# Patient Record
Sex: Male | Born: 1970 | Race: Black or African American | Hispanic: No | Marital: Married | Smoking: Never smoker
Health system: Southern US, Community
[De-identification: ages and names within clinical notes are randomized; demographics above are authoritative.]

## PROBLEM LIST (undated history)

## (undated) DIAGNOSIS — J45909 Unspecified asthma, uncomplicated: Secondary | ICD-10-CM

## (undated) HISTORY — DX: Unspecified asthma, uncomplicated: J45.909

## (undated) HISTORY — PX: HERNIA REPAIR: SHX51

---

## 2003-04-03 ENCOUNTER — Ambulatory Visit (HOSPITAL_COMMUNITY): Admission: RE | Admit: 2003-04-03 | Discharge: 2003-04-03 | Payer: Self-pay | Admitting: Family Medicine

## 2003-07-22 ENCOUNTER — Emergency Department (HOSPITAL_COMMUNITY): Admission: EM | Admit: 2003-07-22 | Discharge: 2003-07-22 | Payer: Self-pay | Admitting: Emergency Medicine

## 2004-01-01 ENCOUNTER — Ambulatory Visit (HOSPITAL_COMMUNITY): Admission: RE | Admit: 2004-01-01 | Discharge: 2004-01-01 | Payer: Self-pay | Admitting: General Surgery

## 2004-01-01 ENCOUNTER — Ambulatory Visit (HOSPITAL_BASED_OUTPATIENT_CLINIC_OR_DEPARTMENT_OTHER): Admission: RE | Admit: 2004-01-01 | Discharge: 2004-01-01 | Payer: Self-pay | Admitting: General Surgery

## 2004-08-08 ENCOUNTER — Emergency Department (HOSPITAL_COMMUNITY): Admission: EM | Admit: 2004-08-08 | Discharge: 2004-08-08 | Payer: Self-pay | Admitting: Emergency Medicine

## 2006-03-27 ENCOUNTER — Emergency Department (HOSPITAL_COMMUNITY): Admission: EM | Admit: 2006-03-27 | Discharge: 2006-03-28 | Payer: Self-pay | Admitting: Emergency Medicine

## 2007-02-20 ENCOUNTER — Emergency Department (HOSPITAL_COMMUNITY): Admission: EM | Admit: 2007-02-20 | Discharge: 2007-02-20 | Payer: Self-pay | Admitting: Emergency Medicine

## 2007-03-28 ENCOUNTER — Emergency Department (HOSPITAL_COMMUNITY): Admission: EM | Admit: 2007-03-28 | Discharge: 2007-03-28 | Payer: Self-pay | Admitting: Family Medicine

## 2008-03-01 ENCOUNTER — Emergency Department (HOSPITAL_COMMUNITY): Admission: EM | Admit: 2008-03-01 | Discharge: 2008-03-01 | Payer: Self-pay | Admitting: Emergency Medicine

## 2008-07-23 ENCOUNTER — Emergency Department (HOSPITAL_COMMUNITY): Admission: EM | Admit: 2008-07-23 | Discharge: 2008-07-23 | Payer: Self-pay | Admitting: Family Medicine

## 2010-09-06 NOTE — Op Note (Signed)
NAME:  Derek Nguyen, Derek Nguyen                          ACCOUNT NO.:  [id number]   MEDICAL RECORD NO.:  [id number]                   PATIENT TYPE:  AMB   LOCATION:  DSC                                  FACILITY:  [facility]   PHYSICIAN:  Anselm Pancoast. Zachery Dakins, M.D.          DATE OF BIRTH:  [birth year]   DATE OF PROCEDURE:  01/01/2004  DATE OF DISCHARGE:                                 OPERATIVE REPORT   PREOPERATIVE DIAGNOSIS:  Umbilical hernia.   POSTOPERATIVE DIAGNOSIS:  Umbilical hernia.   OPERATION PERFORMED:  Repair of umbilical hernia.   SURGEON:  Anselm Pancoast. Zachery Dakins, M.D.   ANESTHESIA:  General.   INDICATIONS FOR PROCEDURE:  Derek Nguyen is a 40 year old African who was  referred by Dr. Ronne Binning for management of a symptomatic umbilical hernia.  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  The patient is quite thin, works I think as a  Glass blower/designer for the [employer] and I recommended that we repair this  with general anesthesia and possibility of using mesh.   DESCRIPTION OF PROCEDURE:  The patient preoperatively was given a gram of  Kefzol, taken to the operative suite and induction of general anesthesia.  The abdomen was prepped with Betadine and surgical scrub and solution and  draped in a sterile manner.  I had made a small umbilical incision really  right though the inferior third of the actual hernia sac since it is so  large defect. I then separated this skin from the underlying hernia sac and  abdominal wall fascia.  I opened into the actual hernia sac since it was so  adherent to the actual skin defect and then dissected back circumferentially  about 3 cm in all directions so you can get to where the base of the fascia  was.  The fascia itself was thin as the patient is thin and is really not  amenable to try to make two layers to put a piece  of Prolene mesh within the  inner areas and I basically repaired this in a pants over vest manner with  two good layers of the fascia and repaired it with 0 Prolene U-stitches.  I  then used a 3-0 Prolene to suture the inferior area so it was lying flat and  then I had actually anesthetized the area with Marcaine for postoperative  pain.  Then the skin was closed after a few subcuticular sutures of 3-0  Vicryl were placed with interrupted sutures.  I trimmed a little bit of the  skin so it lay basically flat and then a little compressive dressing  applied.  The patient will be seen in the office in approximately a week. I  think he will be able to return to work in about three weeks.  Sooner if  they can put him on light duties for approximately two weeks.                                               Anselm Pancoast. Zachery Dakins, M.D.    WJW/MEDQ  D:  01/01/2004  T:  01/01/2004  Job:  [id number]

## 2010-09-06 NOTE — Op Note (Signed)
NAME:  Derek Nguyen, Derek Nguyen                          ACCOUNT NO.:  [id number]   MEDICAL RECORD NO.:  [id number]                   PATIENT TYPE:  OUT   LOCATION:  XRAY                                 FACILITY:  [facility]   PHYSICIAN:  Hilda Lias, M.D.                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   POSTOPERATIVE DIAGNOSIS:  Chronic left L4 radiculopathy secondary to  herniated disk at the level of 3-4 with stenosis at the level of 4-5.   PROCEDURE:  Bilateral 3-4 and 4-5  laminotomies and foraminotomies. Left L3-  4 diskectomy. Microscope.   SURGEON:  Hilda Lias, M.D.   ASSISTANT:  Derek Nguyen, M.D.   INDICATIONS FOR PROCEDURE:  Derek Nguyen is a 40 year old gentleman who was  seen by me in my office because of a history of back pain. This problem  started back in May 2003, when he was at work he was working with some heavy  material and developed sudden onset of back pain radiating down to both  legs. The patient has had conservative treatment and he is not any better.  By the time I saw him,  he had a weak dorsiflexion on the left foot. He has  a burning sensation  also on the left foot. Reflexes were symmetrical. The  MRI showed that he has a herniated disk central to the left at the level of  3-4 with bilateral stenosis at the level of 4-5. He also has some  degenerative disk disease at the level of 5-1.   Because of these findings, surgery was advised. The risks were explained  including  the possibility of no improvement whatsoever because of the  chronicity of the pain, CSF leak, and need for further  surgery which might  involve fusion.   DESCRIPTION OF PROCEDURE:  The patient was taken to the operating room and  after  intubation he was positioned in a prone manner. The back was prepped  with Betadine.   A midline incision from L3 to L4-5 was made. The muscles were retracted  laterally. We  identified  the L4-L5 and L3-S4 space. At the level of the 3-  4 we proceeded with a laminotomy after removing the lower lamina of L3 and  the upper L4. The same procedure was done on the opposite side.   On the left side we found that indeed he had a herniated disk extending to  the left. An incision was made using the pituitary rongeurs. Total gross  diskectomy laterally was accomplished. At the end we had good decompression  for the L3 and L4 nerve roots.   On the right  side we found that he had  stenosis and with the laminotomy,  then after removal of the yellow ligament we  proceeded  with the  foraminotomy to decompress the L3-L4 nerve root. Then our attention was at  the level of 4-5.   The patient at this level had quite a bit of stenosis. A laminotomy at 4-5  bilaterally  was achieved. The yellow ligament also was excised. At this  level we found quite a bit of stenosis, and using the 1, 2 and 3-mm Kerrison  punches we were able to do a foraminotomy. Decompression of the L4-L5 nerve  root was achieved. Bilaterally  we found that the patient had quite a bit of  scar tissue mostly involving the L5 nerve root and lysis was accomplished.   Having decompression, a Valsalva maneuver was  negative. The area was  irrigated. Bio glue was left into the area, especially at the level of 4-5  to prevent the possibility of a CSF leak because of the lysis of adhesions.  Then the area was irrigated and the wound was closed with Vicryl and Steri-  Strips. The patient did well.                                               Hilda Lias, M.D.    EB/MEDQ  D:  05/05/2003  T:  05/05/2003  Job:  [id number]

## 2013-04-06 ENCOUNTER — Ambulatory Visit: Payer: 59 | Admitting: Family Medicine

## 2013-04-06 VITALS — BP 132/82 | HR 71 | Temp 98.3°F | Resp 17 | Ht 68.5 in | Wt 188.0 lb

## 2013-04-06 DIAGNOSIS — J45901 Unspecified asthma with (acute) exacerbation: Secondary | ICD-10-CM

## 2013-04-06 DIAGNOSIS — R0602 Shortness of breath: Secondary | ICD-10-CM

## 2013-04-06 DIAGNOSIS — J45909 Unspecified asthma, uncomplicated: Secondary | ICD-10-CM

## 2013-04-06 MED ORDER — ALBUTEROL SULFATE (2.5 MG/3ML) 0.083% IN NEBU
2.5000 mg | INHALATION_SOLUTION | Freq: Once | RESPIRATORY_TRACT | Status: AC
  Administered 2013-04-06: 2.5 mg via RESPIRATORY_TRACT

## 2013-04-06 MED ORDER — ALBUTEROL SULFATE HFA 108 (90 BASE) MCG/ACT IN AERS: 2.0000 | INHALATION_SPRAY | RESPIRATORY_TRACT | Status: AC | PRN

## 2013-04-06 NOTE — Progress Notes (Signed)
   Subjective:    Patient ID: Derek Nguyen, male    DOB: [birth year], 42 y.o.   MRN: [id number]  HPI Patient presents for breathing problem. In 2010 tried to shampoo carpet and noticed that strong chemical odor caused him to have difficulty breathing. Has history of childhood asthma. Since has had intermittent difficulties breathing. Last night was with friends and they were smoking and really flared breathing problems. Had difficulty breathing last night and could not breathing. Still feeling very tight in chest. Albuterol previously very helpful but has run out. Was using albuterol infrequently and only when in contact with air irritants. Has not required prednisone. Not hospitalized for asthma. No fever, has dry cough. Has chest pain only with coughing. + SOB. Has improved from last night.   Review of Systems  All other systems reviewed and are negative.      Objective:   Physical Exam  Constitutional: He is oriented to person, place, and time. He appears well-developed and well-nourished. No distress.  HENT:  Head: Normocephalic and atraumatic.  Right Ear: External ear normal.  Left Ear: External ear normal.  Mouth/Throat: Oropharynx is clear and moist. No oropharyngeal exudate.  Eyes: Conjunctivae are normal. Pupils are equal, round, and reactive to light. No scleral icterus.  Neck: Normal range of motion. Neck supple.  Cardiovascular: Normal rate, regular rhythm and normal heart sounds.   Pulmonary/Chest: Effort normal. No respiratory distress. He has wheezes.  Musculoskeletal: Normal range of motion.  Lymphadenopathy:    He has no cervical adenopathy.  Neurological: He is alert and oriented to person, place, and time.  Skin: Skin is warm and dry. He is not diaphoretic.  Psychiatric: He has a normal mood and affect. His behavior is normal.   Peak Flow = 250 (normal 509)    Assessment & Plan:  #1. Asthma Exacerbation - Peak flow < 50% of predicted - Will do albuterol  treatment here and recheck peak flow - DC home with albuterol inhaler and spacer.  - Post treatment improved air movement and decreased wheezing. Peak flow still 250 - F/u if worsening symptoms - F/u with PCP within 1 month

## 2013-04-06 NOTE — Patient Instructions (Signed)
Thank you for coming in today  Please use spacer with your inhaler Please use inhaler every 4 hrs for the next day and then as needed See your regular doctor within 1 month Return if your breathing worsens  Asthma, Adult Asthma is a recurring condition in which the airways tighten and narrow. Asthma can make it difficult to breathe. It can cause coughing, wheezing, and shortness of breath. Asthma episodes (also called asthma attacks) range from minor to life-threatening. Asthma cannot be cured, but medicines and lifestyle changes can help control it. CAUSES Asthma is believed to be caused by inherited (genetic) and environmental factors, but its exact cause is unknown. Asthma may be triggered by allergens, lung infections, or irritants in the air. Asthma triggers are different for each person. Common triggers include:   Animal dander.  Dust mites.  Cockroaches.  Pollen from trees or grass.  Mold.  Smoke.  Air pollutants such as dust, household cleaners, hair sprays, aerosol sprays, paint fumes, strong chemicals, or strong odors.  Cold air, weather changes, and winds (which increase molds and pollens in the air).  Strong emotional expressions such as crying or laughing hard.  Stress.  Certain medicines (such as aspirin) or types of drugs (such as beta-blockers).  Sulfites in foods and drinks. Foods and drinks that may contain sulfites include dried fruit, potato chips, and sparkling grape juice.  Infections or inflammatory conditions such as the flu, a cold, or an inflammation of the nasal membranes (rhinitis).  Gastroesophageal reflux disease (GERD).  Exercise or strenuous activity. SYMPTOMS Symptoms may occur immediately after asthma is triggered or many hours later. Symptoms include:  Wheezing.  Excessive nighttime or early morning coughing.  Frequent or severe coughing with a common cold.  Chest tightness.  Shortness of breath. DIAGNOSIS  The diagnosis of  asthma is made by a review of your medical history and a physical exam. Tests may also be performed. These may include:  Lung function studies. These tests show how much air you breath in and out.  Allergy tests.  Imaging tests such as X-rays. TREATMENT  Asthma cannot be cured, but it can usually be controlled. Treatment involves identifying and avoiding your asthma triggers. It also involves medicines. There are 2 classes of medicine used for asthma treatment:   Controller medicines. These prevent asthma symptoms from occurring. They are usually taken every day.  Reliever or rescue medicines. These quickly relieve asthma symptoms. They are used as needed and provide short-term relief. Your health care provider will help you create an asthma action plan. An asthma action plan is a written plan for managing and treating your asthma attacks. It includes a list of your asthma triggers and how they may be avoided. It also includes information on when medicines should be taken and when their dosage should be changed. An action plan may also involve the use of a device called a peak flow meter. A peak flow meter measures how well the lungs are working. It helps you monitor your condition. HOME CARE INSTRUCTIONS   Take medicine as directed by your health care provider. Speak with your health care provider if you have questions about how or when to take the medicines.  Use a peak flow meter as directed by your health care provider. Record and keep track of readings.  Understand and use the action plan to help minimize or stop an asthma attack without needing to seek medical care.  Control your home environment in the following ways to help  prevent asthma attacks:  Do not smoke. Avoid being exposed to secondhand smoke.  Change your heating and air conditioning filter regularly.  Limit your use of fireplaces and wood stoves.  Get rid of pests (such as roaches and mice) and their  droppings.  Throw away plants if you see mold on them.  Clean your floors and dust regularly. Use unscented cleaning products.  Try to have someone else vacuum for you regularly. Stay out of rooms while they are being vacuumed and for a short while afterward. If you vacuum, use a dust mask from a hardware store, a double-layered or microfilter vacuum cleaner bag, or a vacuum cleaner with a HEPA filter.  Replace carpet with wood, tile, or vinyl flooring. Carpet can trap dander and dust.  Use allergy-proof pillows, mattress covers, and box spring covers.  Wash bed sheets and blankets every week in hot water and dry them in a dryer.  Use blankets that are made of polyester or cotton.  Clean bathrooms and kitchens with bleach. If possible, have someone repaint the walls in these rooms with mold-resistant paint. Keep out of the rooms that are being cleaned and painted.  Wash hands frequently. SEEK MEDICAL CARE IF:   You have wheezing, shortness of breath, or a cough even if taking medicine to prevent attacks.  The colored mucus you cough up (sputum) is thicker than usual.  Your sputum changes from clear or white to yellow, green, gray, or bloody.  You have any problems that may be related to the medicines you are taking (such as a rash, itching, swelling, or trouble breathing).  You are using a reliever medicine more than 2 3 times per week.  Your peak flow is still at 50 79% of you personal best after following your action plan for 1 hour. SEEK IMMEDIATE MEDICAL CARE IF:   You seem to be getting worse and are unresponsive to treatment during an asthma attack.  You are short of breath even at rest.  You get short of breath when doing very little physical activity.  You have difficulty eating, drinking, or talking due to asthma symptoms.  You develop chest pain.  You develop a fast heartbeat.  You have a bluish color to your lips or fingernails.  You are lightheaded, dizzy,  or faint.  Your peak flow is less than 50% of your personal best.  You have a fever or persistent symptoms for more than 2 3 days.  You have a fever and symptoms suddenly get worse. MAKE SURE YOU:   Understand these instructions.  Will watch your condition.  Will get help right away if you are not doing well or get worse. Document Released: 04/07/2005 Document Revised: 12/08/2012 Document Reviewed: 11/04/2012 [facility] Patient Information 2014 [place], [place].

## 2013-05-19 NOTE — Progress Notes (Signed)
Reviewed documentation and agree w/ assessment and plan. Urho Rio, MD MPH 

## 2016-05-21 DIAGNOSIS — Z23 Encounter for immunization: Secondary | ICD-10-CM | POA: Diagnosis not present

## 2016-05-21 DIAGNOSIS — J45909 Unspecified asthma, uncomplicated: Secondary | ICD-10-CM | POA: Diagnosis not present

## 2017-01-20 DIAGNOSIS — Z23 Encounter for immunization: Secondary | ICD-10-CM | POA: Diagnosis not present

## 2017-02-05 DIAGNOSIS — Z0000 Encounter for general adult medical examination without abnormal findings: Secondary | ICD-10-CM | POA: Diagnosis not present

## 2017-08-18 DIAGNOSIS — R42 Dizziness and giddiness: Secondary | ICD-10-CM | POA: Diagnosis not present

## 2018-03-02 DIAGNOSIS — Z0000 Encounter for general adult medical examination without abnormal findings: Secondary | ICD-10-CM | POA: Diagnosis not present

## 2018-03-02 DIAGNOSIS — Z13220 Encounter for screening for lipoid disorders: Secondary | ICD-10-CM | POA: Diagnosis not present

## 2018-03-02 DIAGNOSIS — Z23 Encounter for immunization: Secondary | ICD-10-CM | POA: Diagnosis not present

## 2019-04-26 ENCOUNTER — Emergency Department (INDEPENDENT_AMBULATORY_CARE_PROVIDER_SITE_OTHER): Payer: 59

## 2019-04-26 ENCOUNTER — Emergency Department (INDEPENDENT_AMBULATORY_CARE_PROVIDER_SITE_OTHER)
Admission: EM | Admit: 2019-04-26 | Discharge: 2019-04-26 | Disposition: A | Discharge status: Home / Self Care | Payer: 59 | Source: Home / Self Care

## 2019-04-26 ENCOUNTER — Other Ambulatory Visit: Payer: Self-pay

## 2019-04-26 DIAGNOSIS — R0789 Other chest pain: Secondary | ICD-10-CM | POA: Diagnosis not present

## 2019-04-26 DIAGNOSIS — R079 Chest pain, unspecified: Secondary | ICD-10-CM

## 2019-04-26 NOTE — ED Triage Notes (Signed)
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  Was seen by person at work, and referred here

## 2019-04-26 NOTE — Discharge Instructions (Addendum)
Your EKG and CXR are normal This pain in your chest is likely muscular You will need to quarantine until receiving the results of your COVID swab We will call you with results of your COVID swab and labs You can take tylenol, motrin, aleve, ibuprofen for the pain

## 2019-04-26 NOTE — ED Provider Notes (Signed)
[facility]    CSN: [id number] Arrival date & time: 04/26/19  1446      History   Chief Complaint Chief Complaint  Patient presents with  . Chest Pain    HPI Derek Nguyen is a 49 y.o. male.   49 year old male, with history of asthma, presenting today complaining of chest pain.  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  States that shortly after doing so, he developed some chest pain.  States that this pain is worse with movement of his neck as well as his chest wall.  States that he was sent from work to be evaluated to the chest pain.  States that he is concerned this may related to Covid.  He denies any cough or shortness of breath.  The history is provided by the patient.  Chest Pain Pain location:  Substernal area Pain quality: aching   Pain radiates to:  Does not radiate Pain severity:  Mild Onset quality:  Gradual Duration:  5 hours Timing:  Constant Progression:  Unchanged Chronicity:  New Context: lifting and movement   Context: not breathing, not drug use and not eating   Relieved by:  Nothing Worsened by:  Nothing Ineffective treatments:  None tried Associated symptoms: no abdominal pain, no altered mental status, no anxiety, no back pain, no claudication, no cough, no diaphoresis, no dizziness, no fatigue, no fever, no headache, no lower extremity edema, no near-syncope, no numbness, no palpitations, no shortness of breath, no vomiting and no weakness   Risk factors: male sex and surgery   Risk factors: no aortic disease, no coronary artery disease, no Ehlers-Danlos syndrome, no high cholesterol, no immobilization, not obese and no prior DVT/PE     Past Medical History:  Diagnosis Date  . Asthma     Patient Active Problem List   Diagnosis Date Noted  . Asthma, chronic 04/06/2013    Past Surgical History:  Procedure Laterality Date  . HERNIA REPAIR         Home Medications     Prior to Admission medications   Medication Sig Start Date End Date Taking? Authorizing Provider  albuterol (PROVENTIL HFA;VENTOLIN HFA) 108 (90 BASE) MCG/ACT inhaler Inhale 2 puffs into the lungs every 4 (four) hours as needed for wheezing or shortness of breath (cough, shortness of breath or wheezing.). 04/06/13   Daine Gip, MD    Family History History reviewed. No pertinent family history.  Social History Social History   Tobacco Use  . Smoking status: Never Smoker  . Smokeless tobacco: Never Used  Substance Use Topics  . Alcohol use: No  . Drug use: No     Allergies   Ibuprofen   Review of Systems Review of Systems  Constitutional: Negative for chills, diaphoresis, fatigue and fever.  HENT: Negative for ear pain and sore throat.   Eyes: Negative for pain and visual disturbance.  Respiratory: Negative for cough and shortness of breath.   Cardiovascular: Positive for chest pain. Negative for palpitations, claudication and near-syncope.  Gastrointestinal: Negative for abdominal pain and vomiting.  Genitourinary: Negative for dysuria and hematuria.  Musculoskeletal: Negative for arthralgias and back pain.  Skin: Negative for color change and rash.  Neurological: Negative for dizziness, seizures, syncope, weakness, numbness and headaches.  All other systems reviewed and are negative.    Physical Exam Triage Vital Signs ED Triage Vitals  Enc Vitals Group     BP  Pulse      Resp      Temp      Temp src      SpO2      Weight      Height      Head Circumference      Peak Flow      Pain Score      Pain Loc      Pain Edu?      Excl. in [place]?    No data found.  Updated Vital Signs BP 132/80 (BP Location: Left Arm)   Pulse 60   Temp 98.6 F (37 C) (Oral)   Resp 20   Ht 5\' 8"  (1.727 m)   Wt 207 lb (93.9 kg)   SpO2 98%   BMI 31.47 kg/m   Visual Acuity Right Eye Distance:   Left Eye Distance:   Bilateral Distance:    Right Eye Near:   Left  Eye Near:    Bilateral Near:     Physical Exam Vitals and nursing note reviewed.  Constitutional:      Appearance: He is well-developed.  HENT:     Head: Normocephalic and atraumatic.  Eyes:     Conjunctiva/sclera: Conjunctivae normal.  Cardiovascular:     Rate and Rhythm: Normal rate and regular rhythm.     Pulses: Normal pulses.     Heart sounds: No murmur.  Pulmonary:     Effort: Pulmonary effort is normal. No respiratory distress.     Breath sounds: Normal breath sounds.  Abdominal:     Palpations: Abdomen is soft.     Tenderness: There is no abdominal tenderness.  Musculoskeletal:     Cervical back: Neck supple.  Skin:    General: Skin is warm and dry.  Neurological:     Mental Status: He is alert.      UC Treatments / Results  Labs (all labs ordered are listed, but only abnormal results are displayed) Labs Reviewed  NOVEL CORONAVIRUS, NAA  COMPLETE METABOLIC PANEL WITH GFR  TROPONIN I (HIGH SENSITIVITY)    EKG   Radiology DG Chest 2 View  Result Date: 04/26/2019 CLINICAL DATA:  Sharp chest pain after lifting at work. EXAM: CHEST - 2 VIEW COMPARISON:  02/20/2007 FINDINGS: Heart size is normal. Mediastinal shadows are normal. Lungs are clear. The vascularity is normal. No effusions. No significant bone finding. IMPRESSION: Normal chest Electronically Signed   By: Nelson Chimes M.D.   On: 04/26/2019 15:43    Procedures Procedures (including critical care time)  Medications Ordered in UC Medications - No data to display  Initial Impression / Assessment and Plan / UC Course  I have reviewed the triage vital signs and the nursing notes.  Pertinent labs & imaging results that were available during my care of the patient were reviewed by me and considered in my medical decision making (see chart for details).     Chest pain is started this morning after lifting heavy pallets at work.  Patient said persistent pain for about 5 hours.  Worsened with movement of  the neck as well as the chest wall.  He has no diaphoresis, nausea vomiting, shortness of breath, radiation of the pain.  Patient has no cardiac risk factors.  EKG, chest x-ray, labs and Covid swab pending.  Patient has a heart score of one. EKG and chest x-ray unremarkable.  Labs and Covid swab pending.  He will quarantine until receiving results. Return precautions discussed Final Clinical Impressions(s) / UC  Diagnoses   Final diagnoses:  Chest pain, unspecified type  Chest wall pain     Discharge Instructions     Your EKG and CXR are normal This pain in your chest is likely muscular You will need to quarantine until receiving the results of your COVID swab We will call you with results of your COVID swab and labs You can take tylenol, motrin, aleve, ibuprofen for the pain    ED Prescriptions    None     PDMP not reviewed this encounter.   Alecia Lemming, [place] 04/26/19 1549

## 2019-04-27 ENCOUNTER — Telehealth: Payer: Self-pay

## 2019-04-27 LAB — COMPLETE METABOLIC PANEL WITH GFR
AG Ratio: 1.2 (calc) (ref 1.0–2.5)
ALT: 32 U/L (ref 9–46)
AST: 26 U/L (ref 10–40)
Albumin: 4.1 g/dL (ref 3.6–5.1)
Alkaline phosphatase (APISO): 55 U/L (ref 36–130)
BUN: 10 mg/dL (ref 7–25)
CO2: 25 mmol/L (ref 20–32)
Calcium: 9.1 mg/dL (ref 8.6–10.3)
Chloride: 105 mmol/L (ref 98–110)
Creat: 0.94 mg/dL (ref 0.60–1.35)
GFR, Est African American: 110 mL/min/{1.73_m2} (ref >=60)
GFR, Est Non African American: 95 mL/min/{1.73_m2} (ref >=60)
Globulin: 3.3 g/dL (calc) (ref 1.9–3.7)
Glucose, Bld: 89 mg/dL (ref 65–99)
Potassium: 3.8 mmol/L (ref 3.5–5.3)
Sodium: 139 mmol/L (ref 135–146)
Total Bilirubin: 0.5 mg/dL (ref 0.2–1.2)
Total Protein: 7.4 g/dL (ref 6.1–8.1)

## 2019-04-27 LAB — TROPONIN I: Troponin I: <0.01 ng/mL (ref <=0.0)

## 2019-04-27 NOTE — Telephone Encounter (Signed)
Called asking about test results. Informed covid results are not back yet, usually take 3-5 days. Pt acknowledges.

## 2019-04-29 LAB — NOVEL CORONAVIRUS, NAA: SARS-CoV-2, NAA: NOT DETECTED

## 2021-03-29 ENCOUNTER — Ambulatory Visit (INDEPENDENT_AMBULATORY_CARE_PROVIDER_SITE_OTHER): Payer: 59 | Admitting: Podiatry

## 2021-03-29 ENCOUNTER — Other Ambulatory Visit: Payer: Self-pay

## 2021-03-29 ENCOUNTER — Ambulatory Visit (INDEPENDENT_AMBULATORY_CARE_PROVIDER_SITE_OTHER): Payer: 59

## 2021-03-29 ENCOUNTER — Encounter: Payer: Self-pay | Admitting: Podiatry

## 2021-03-29 DIAGNOSIS — M7672 Peroneal tendinitis, left leg: Secondary | ICD-10-CM

## 2021-03-29 DIAGNOSIS — M25572 Pain in left ankle and joints of left foot: Secondary | ICD-10-CM

## 2021-03-29 DIAGNOSIS — M79672 Pain in left foot: Secondary | ICD-10-CM

## 2021-03-29 NOTE — Patient Instructions (Signed)
Peroneal Tendinopathy Rehab ?Ask your health care provider which exercises are safe for you. Do exercises exactly as told by your health care provider and adjust them as directed. It is normal to feel mild stretching, pulling, tightness, or discomfort as you do these exercises. Stop right away if you feel sudden pain or your pain gets worse. Do not begin these exercises until told by your health care provider. ?Stretching and range-of-motion exercises ?These exercises warm up your muscles and joints and improve the movement and flexibility of your ankle. These exercises also help to relieve pain and stiffness. ?Gastroc and soleus stretch, standing ?This is an exercise in which you stand on a step and use your body weight to stretch your calf muscles. To do this exercise: ?Stand on the edge of a step on the ball of your left / right foot. The ball of your foot is on the walking surface, right under your toes. ?Keep your other foot firmly on the same step. ?Hold on to the wall, a railing, or a chair for balance. ?Slowly lift your other foot, allowing your body weight to press your left / right heel down over the edge of the step. You should feel a stretch in your left / right calf (gastrocnemius and soleus). ?Hold this position for __________ seconds. ?Return both feet to the step. ?Repeat this exercise with a slight bend in your left / right knee. ?Repeat __________ times with your left / right knee straight and __________ times with your left / right knee bent. Complete this exercise __________ times a day. ?Strengthening exercises ?These exercises build strength and endurance in your foot and ankle. Endurance is the ability to use your muscles for a long time, even after they get tired. ?Ankle dorsiflexion with band ? ?Secure a rubber exercise band or tube to an object, such as a table leg, that will not move when the band is pulled. ?Secure the other end of the band around your left / right foot. ?Sit on the  floor, facing the object with your left / right leg extended. The band or tube should be slightly tense when your foot is relaxed. ?Slowly flex your left / right ankle and toes to bring your foot toward you (dorsiflexion). ?Hold this position for __________ seconds. ?Let the band or tube slowly pull your foot back to the starting position. ?Repeat __________ times. Complete this exercise __________ times a day. ?Ankle eversion ?Sit on the floor with your legs straight out in front of you. ?Loop a rubber exercise band or tube around the ball of your left / right foot. The ball of your foot is on the walking surface, right under your toes. ?Hold the ends of the band in your hands, or secure the band to a stable object. The band or tube should be slightly tense when your foot is relaxed. ?Slowly push your foot outward, away from your other leg (eversion). ?Hold this position for __________ seconds. ?Slowly return your foot to the starting position. ?Repeat __________ times. Complete this exercise __________ times a day. ?Plantar flexion, standing ?This exercise is sometimes called standing heel raise. ?Stand with your feet shoulder-width apart. ?Place your hands on a wall or table to steady yourself as needed, but try not to use it for support. ?Keep your weight spread evenly over the width of your feet while you slowly rise up on your toes (plantar flexion). If told by your health care provider: ?Shift your weight toward your left / right   leg until you feel challenged. ?Stand on your left / right leg only. ?Hold this position for __________ seconds. ?Repeat __________ times. Complete this exercise __________ times a day. ?Single leg stand ?Without shoes, stand near a railing or in a doorway. You may hold on to the railing or door frame as needed. ?Stand on your left / right foot. Keep your big toe down on the floor and try to keep your arch lifted. ?Do not roll to the outside of your foot. ?If this exercise is too  easy, you can try it with your eyes closed or while standing on a pillow. ?Hold this position for __________ seconds. ?Repeat __________ times. Complete this exercise __________ times a day. ?This information is not intended to replace advice given to you by your health care provider. Make sure you discuss any questions you have with your health care provider. ?Document Revised: 07/27/2018 Document Reviewed: 07/27/2018 ?[employer] Patient Education ? 2022 [employer] ? ?

## 2021-03-29 NOTE — Progress Notes (Signed)
  Subjective:  Patient ID: Derek Nguyen, male    DOB: 1970/11/19,   MRN: [id number]  Chief Complaint  Patient presents with   Foot Pain    left foot pain-near the bottom    50 y.o. male presents for left foot pain that has been present for several months. Relates he gets constant pain around the ankle when walking or running. Relates some swelling and throbbing pain all around the ankle but mostly on the outside. Relates he has tried soaking without much relief.  Unable to take NSAIDs.  . Denies any other pedal complaints. Denies n/v/f/c.   Past Medical History:  Diagnosis Date   Asthma     Objective:  Physical Exam: Vascular: DP/PT pulses 2/4 bilateral. CFT <3 seconds. Normal hair growth on digits. No edema.  Skin. No lacerations or abrasions bilateral feet.  Musculoskeletal: MMT 5/5 bilateral lower extremities in DF, PF, Inversion and Eversion. Deceased ROM in DF of ankle joint. Tender along peroneal tendon posterior to lateral malleolus. Relates some tenderness along anterior and medial ankle joint. Pain with eversion of the foot.  Neurological: Sensation intact to light touch.   Assessment:   1. Peroneal tendonitis, left      Plan:  Patient was evaluated and treated and all questions answered. X-rays reviewed and discussed with patient. No acute fractures or dislocations.  Discussed peroneal tendinitis and treatment options at length with patient Discussed stretching exercises and provided handout. Dispensed Tri-Lock ankle brace. Discussed that if the symptoms do not improve can consider PT/MRI. Patient to return  as needed.    Louann Sjogren, DPM

## 2021-04-11 ENCOUNTER — Telehealth: Payer: Self-pay | Admitting: Podiatry

## 2021-04-11 NOTE — Telephone Encounter (Signed)
Patient called to check on his Xray results? He said he had them done on 12/9 . Please advise

## 2021-04-11 NOTE — Telephone Encounter (Signed)
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

## 2023-04-30 IMAGING — DX DG FOOT COMPLETE 3+V*L*
3 series · 3 of 3 positions shown · non-contrast
Comparison: None.

CLINICAL DATA: All ankle pain for months without injury.

EXAM:
LEFT FOOT - COMPLETE 3+ VIEW

[foot ap wb]
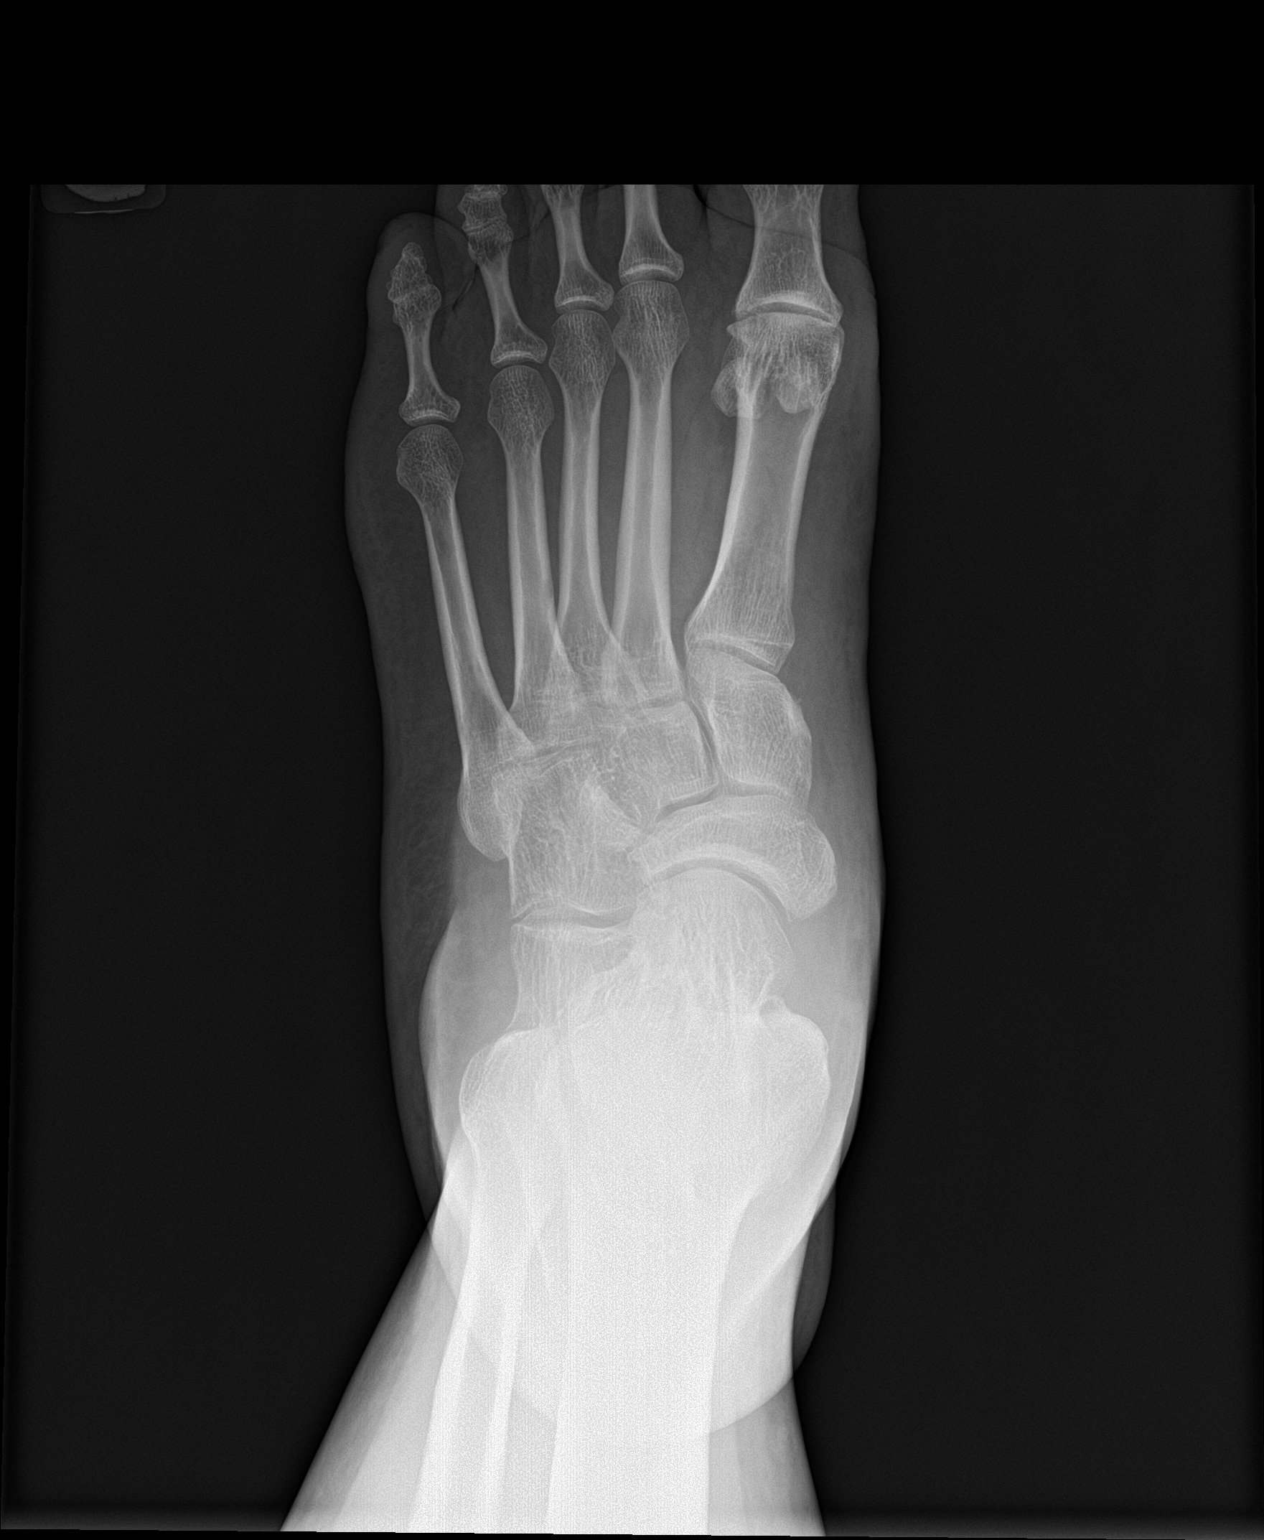

[foot obl wb]
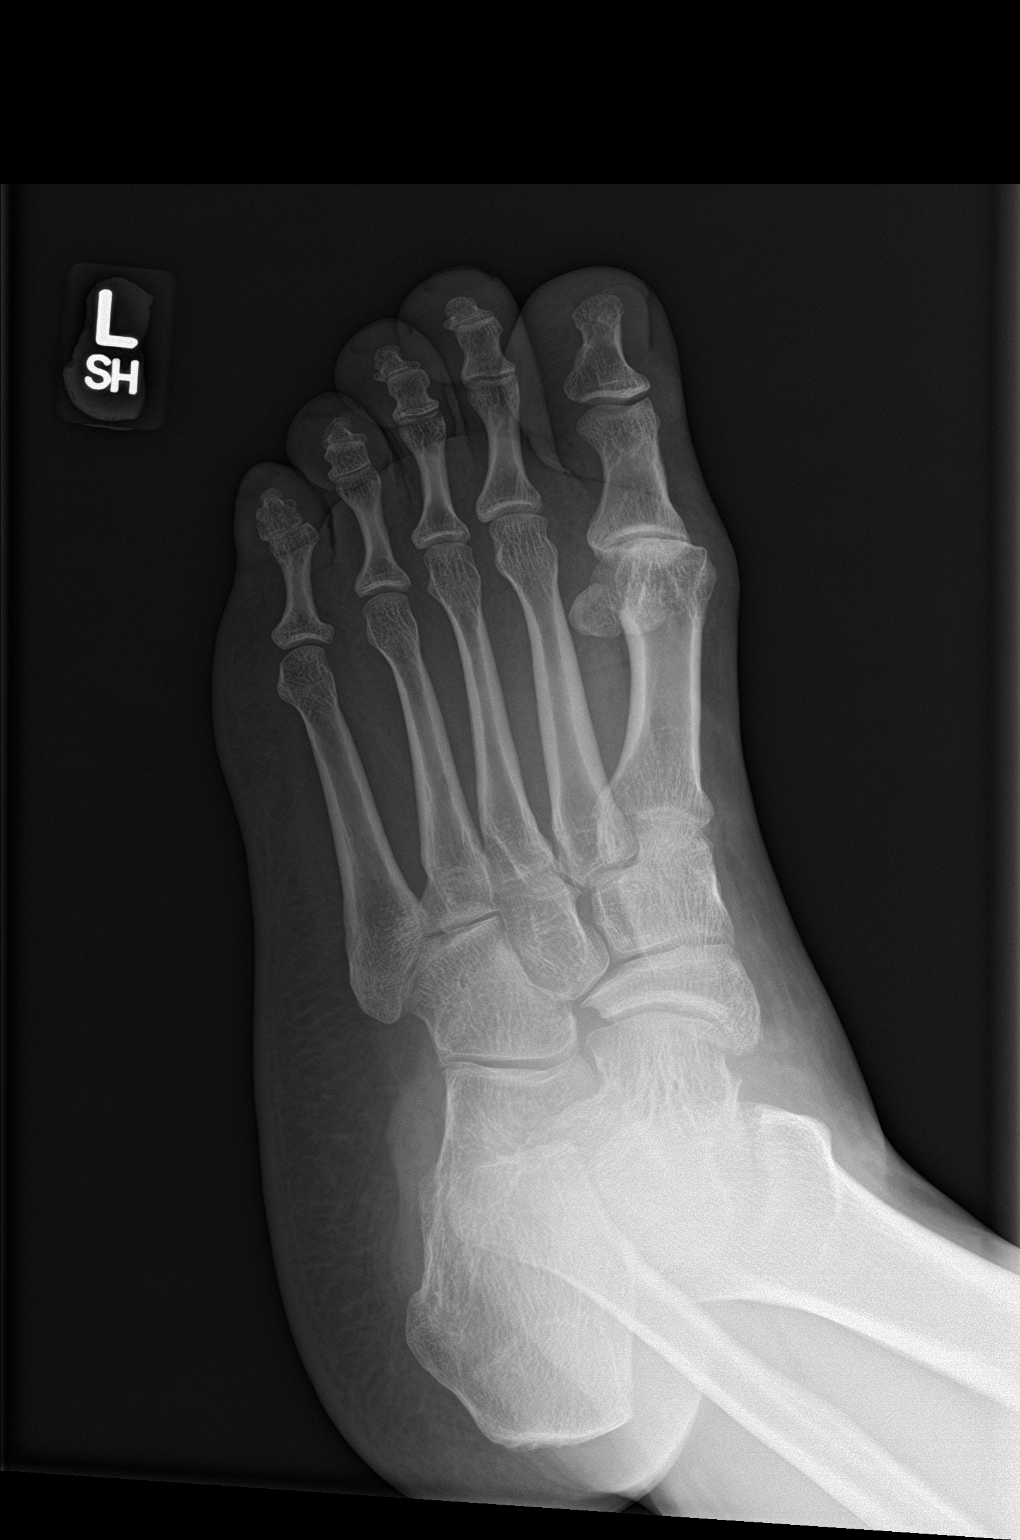

[foot lat wb]
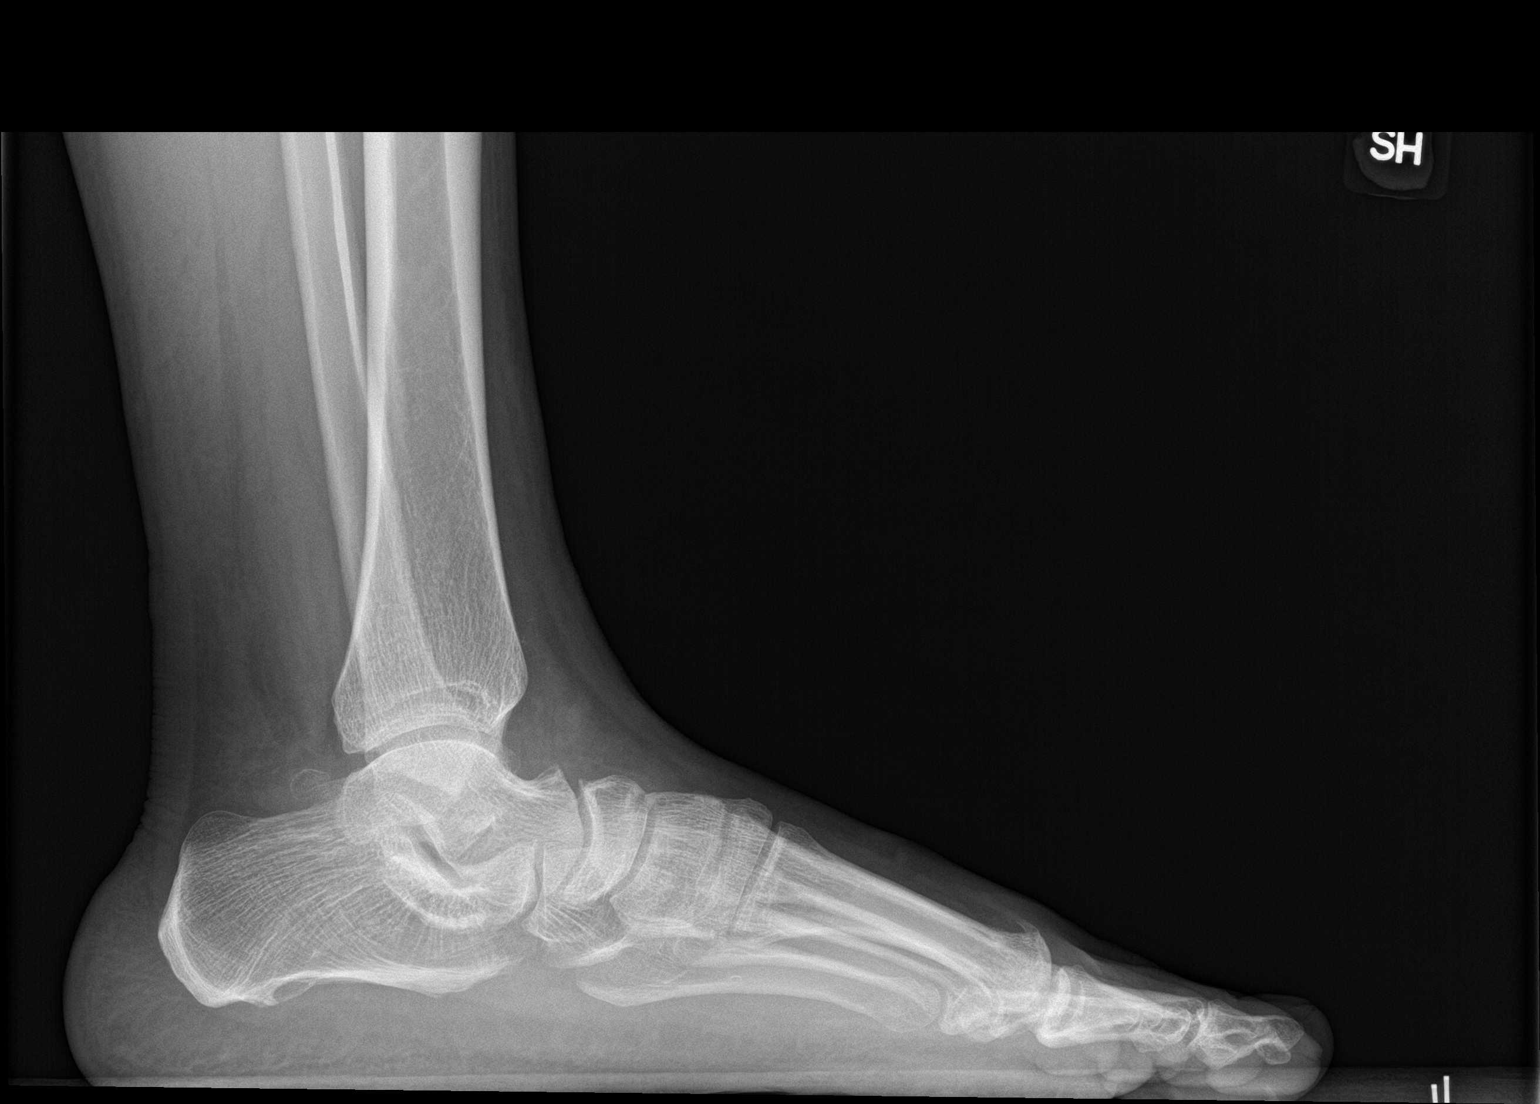

[3 of 3 positions shown; findings below may reference images not displayed]

FINDINGS: Pes planus deformity. Mild degenerative changes in the midfoot with
a small osteophyte off the navicular bone. No fracture dislocation.
No other abnormalities.
IMPRESSION: 1. Pes planus.
2. Mild degenerative changes in the midfoot.
